# Patient Record
Sex: Male | Born: 1940 | Marital: Married
Health system: Southern US, Community
[De-identification: ages and names within clinical notes are randomized; demographics above are authoritative.]

---

## 2015-01-12 ENCOUNTER — Inpatient Hospital Stay
Admission: AD | Admit: 2015-01-12 | Discharge: 2015-02-13 | Disposition: A | Discharge status: Skilled Nursing Facility | Payer: Self-pay | Source: Ambulatory Visit | Attending: Internal Medicine | Admitting: Internal Medicine

## 2015-01-12 ENCOUNTER — Other Ambulatory Visit (HOSPITAL_COMMUNITY): Payer: Self-pay

## 2015-01-12 DIAGNOSIS — J811 Chronic pulmonary edema: Secondary | ICD-10-CM

## 2015-01-12 DIAGNOSIS — S065XAA Traumatic subdural hemorrhage with loss of consciousness status unknown, initial encounter: Secondary | ICD-10-CM

## 2015-01-12 DIAGNOSIS — K7460 Unspecified cirrhosis of liver: Secondary | ICD-10-CM

## 2015-01-12 DIAGNOSIS — K458 Other specified abdominal hernia without obstruction or gangrene: Secondary | ICD-10-CM

## 2015-01-12 DIAGNOSIS — Z95828 Presence of other vascular implants and grafts: Secondary | ICD-10-CM

## 2015-01-12 DIAGNOSIS — I639 Cerebral infarction, unspecified: Secondary | ICD-10-CM

## 2015-01-12 DIAGNOSIS — K469 Unspecified abdominal hernia without obstruction or gangrene: Secondary | ICD-10-CM

## 2015-01-12 DIAGNOSIS — J189 Pneumonia, unspecified organism: Secondary | ICD-10-CM

## 2015-01-12 DIAGNOSIS — Z431 Encounter for attention to gastrostomy: Secondary | ICD-10-CM

## 2015-01-12 DIAGNOSIS — S065X9A Traumatic subdural hemorrhage with loss of consciousness of unspecified duration, initial encounter: Secondary | ICD-10-CM

## 2015-01-13 ENCOUNTER — Other Ambulatory Visit (HOSPITAL_COMMUNITY): Payer: Self-pay

## 2015-01-13 LAB — URINALYSIS, ROUTINE W REFLEX MICROSCOPIC
GLUCOSE, UA: NEGATIVE mg/dL
HGB URINE DIPSTICK: NEGATIVE
KETONES UR: NEGATIVE mg/dL
LEUKOCYTES UA: NEGATIVE
Nitrite: NEGATIVE
PH: 7 (ref 5.0–8.0)
PROTEIN: NEGATIVE mg/dL
Specific Gravity, Urine: 1.022 (ref 1.005–1.030)
Urobilinogen, UA: >8 mg/dL — ABNORMAL HIGH (ref 0.0–1.0)

## 2015-01-13 LAB — COMPREHENSIVE METABOLIC PANEL
ALBUMIN: 2 g/dL — AB (ref 3.5–5.0)
ALK PHOS: 327 U/L — AB (ref 38–126)
ALT: 413 U/L — ABNORMAL HIGH (ref 17–63)
ANION GAP: 9 (ref 5–15)
AST: 261 U/L — ABNORMAL HIGH (ref 15–41)
BUN: 24 mg/dL — ABNORMAL HIGH (ref 6–20)
CHLORIDE: 112 mmol/L — AB (ref 101–111)
CO2: 25 mmol/L (ref 22–32)
Calcium: 8.7 mg/dL — ABNORMAL LOW (ref 8.9–10.3)
Creatinine, Ser: 0.84 mg/dL (ref 0.61–1.24)
GFR calc Af Amer: >60 mL/min (ref >60)
GFR calc non Af Amer: >60 mL/min (ref >60)
GLUCOSE: 128 mg/dL — AB (ref 65–99)
POTASSIUM: 3.9 mmol/L (ref 3.5–5.1)
SODIUM: 146 mmol/L — AB (ref 135–145)
Total Bilirubin: 1.4 mg/dL — ABNORMAL HIGH (ref 0.3–1.2)
Total Protein: 6.1 g/dL — ABNORMAL LOW (ref 6.5–8.1)

## 2015-01-13 LAB — CBC WITH DIFFERENTIAL/PLATELET
BASOS ABS: 0.2 10*3/uL — AB (ref 0.0–0.1)
Basophils Relative: 1 % (ref 0–1)
EOS ABS: 0.6 10*3/uL (ref 0.0–0.7)
Eosinophils Relative: 3 % (ref 0–5)
HCT: 30.2 % — ABNORMAL LOW (ref 39.0–52.0)
HEMOGLOBIN: 8.8 g/dL — AB (ref 13.0–17.0)
LYMPHS ABS: 1.5 10*3/uL (ref 0.7–4.0)
LYMPHS PCT: 8 % — AB (ref 12–46)
MCH: 21.5 pg — ABNORMAL LOW (ref 26.0–34.0)
MCHC: 29.1 g/dL — AB (ref 30.0–36.0)
MCV: 73.8 fL — ABNORMAL LOW (ref 78.0–100.0)
MONOS PCT: 8 % (ref 3–12)
Monocytes Absolute: 1.5 10*3/uL — ABNORMAL HIGH (ref 0.1–1.0)
Neutro Abs: 14.7 10*3/uL — ABNORMAL HIGH (ref 1.7–7.7)
Neutrophils Relative %: 80 % — ABNORMAL HIGH (ref 43–77)
PLATELETS: 1324 10*3/uL — AB (ref 150–400)
RBC: 4.09 MIL/uL — AB (ref 4.22–5.81)
RDW: 26 % — ABNORMAL HIGH (ref 11.5–15.5)
Smear Review: INCREASED
WBC: 18.5 10*3/uL — AB (ref 4.0–10.5)

## 2015-01-13 LAB — PATHOLOGIST SMEAR REVIEW: PATH REVIEW: INCREASED

## 2015-01-13 LAB — PROCALCITONIN: PROCALCITONIN: 0.56 ng/mL

## 2015-01-13 LAB — PHOSPHORUS: PHOSPHORUS: 3.1 mg/dL (ref 2.5–4.6)

## 2015-01-13 LAB — MAGNESIUM: Magnesium: 2.4 mg/dL (ref 1.7–2.4)

## 2015-01-13 LAB — VITAMIN B12: VITAMIN B 12: 997 pg/mL — AB (ref 180–914)

## 2015-01-14 LAB — EXPECTORATED SPUTUM ASSESSMENT W REFEX TO RESP CULTURE: SPECIAL REQUESTS: NORMAL

## 2015-01-14 LAB — BLOOD GAS, ARTERIAL
ACID-BASE EXCESS: 0 mmol/L (ref 0.0–2.0)
Bicarbonate: 23.3 mEq/L (ref 20.0–24.0)
FIO2: 0.35
O2 SAT: 93.7 %
PATIENT TEMPERATURE: 103
PO2 ART: 82.1 mmHg (ref 80.0–100.0)
TCO2: 24.3 mmol/L (ref 0–100)
pCO2 arterial: 36.2 mmHg (ref 35.0–45.0)
pH, Arterial: 7.436 (ref 7.350–7.450)

## 2015-01-14 LAB — COMPREHENSIVE METABOLIC PANEL
ALT: 367 U/L — ABNORMAL HIGH (ref 17–63)
ANION GAP: 7 (ref 5–15)
AST: 186 U/L — ABNORMAL HIGH (ref 15–41)
Albumin: 2 g/dL — ABNORMAL LOW (ref 3.5–5.0)
Alkaline Phosphatase: 336 U/L — ABNORMAL HIGH (ref 38–126)
BUN: 24 mg/dL — ABNORMAL HIGH (ref 6–20)
CHLORIDE: 112 mmol/L — AB (ref 101–111)
CO2: 26 mmol/L (ref 22–32)
Calcium: 8.8 mg/dL — ABNORMAL LOW (ref 8.9–10.3)
Creatinine, Ser: 0.8 mg/dL (ref 0.61–1.24)
Glucose, Bld: 150 mg/dL — ABNORMAL HIGH (ref 65–99)
POTASSIUM: 4 mmol/L (ref 3.5–5.1)
Sodium: 145 mmol/L (ref 135–145)
Total Bilirubin: 1 mg/dL (ref 0.3–1.2)
Total Protein: 6.5 g/dL (ref 6.5–8.1)

## 2015-01-14 LAB — CBC WITH DIFFERENTIAL/PLATELET
BASOS PCT: 1 % (ref 0–1)
Basophils Absolute: 0.2 10*3/uL — ABNORMAL HIGH (ref 0.0–0.1)
EOS ABS: 0.7 10*3/uL (ref 0.0–0.7)
EOS PCT: 4 % (ref 0–5)
HCT: 31.2 % — ABNORMAL LOW (ref 39.0–52.0)
Hemoglobin: 8.8 g/dL — ABNORMAL LOW (ref 13.0–17.0)
LYMPHS ABS: 1.9 10*3/uL (ref 0.7–4.0)
Lymphocytes Relative: 11 % — ABNORMAL LOW (ref 12–46)
MCH: 20.9 pg — AB (ref 26.0–34.0)
MCHC: 28.2 g/dL — ABNORMAL LOW (ref 30.0–36.0)
MCV: 73.9 fL — AB (ref 78.0–100.0)
MONO ABS: 0.9 10*3/uL (ref 0.1–1.0)
Monocytes Relative: 5 % (ref 3–12)
Neutro Abs: 13.9 10*3/uL — ABNORMAL HIGH (ref 1.7–7.7)
Neutrophils Relative %: 79 % — ABNORMAL HIGH (ref 43–77)
PLATELETS: 1451 10*3/uL — AB (ref 150–400)
RBC: 4.22 MIL/uL (ref 4.22–5.81)
RDW: 26.1 % — AB (ref 11.5–15.5)
WBC: 17.6 10*3/uL — AB (ref 4.0–10.5)

## 2015-01-14 LAB — EXPECTORATED SPUTUM ASSESSMENT W GRAM STAIN, RFLX TO RESP C

## 2015-01-14 LAB — URINE CULTURE
CULTURE: NO GROWTH
SPECIAL REQUESTS: NORMAL

## 2015-01-14 LAB — PHOSPHORUS: PHOSPHORUS: 3.1 mg/dL (ref 2.5–4.6)

## 2015-01-14 LAB — MAGNESIUM: MAGNESIUM: 2.5 mg/dL — AB (ref 1.7–2.4)

## 2015-01-15 ENCOUNTER — Other Ambulatory Visit (HOSPITAL_COMMUNITY): Payer: Self-pay

## 2015-01-15 LAB — CBC WITH DIFFERENTIAL/PLATELET
BASOS ABS: 0.2 10*3/uL — AB (ref 0.0–0.1)
BASOS PCT: 1 % (ref 0–1)
Eosinophils Absolute: 0.8 10*3/uL — ABNORMAL HIGH (ref 0.0–0.7)
Eosinophils Relative: 4 % (ref 0–5)
HEMATOCRIT: 34.2 % — AB (ref 39.0–52.0)
HEMOGLOBIN: 9.8 g/dL — AB (ref 13.0–17.0)
LYMPHS PCT: 10 % — AB (ref 12–46)
Lymphs Abs: 2.2 10*3/uL (ref 0.7–4.0)
MCH: 21.6 pg — ABNORMAL LOW (ref 26.0–34.0)
MCHC: 28.7 g/dL — AB (ref 30.0–36.0)
MCV: 75.3 fL — ABNORMAL LOW (ref 78.0–100.0)
MONOS PCT: 7 % (ref 3–12)
Monocytes Absolute: 1.7 10*3/uL — ABNORMAL HIGH (ref 0.1–1.0)
NEUTROS ABS: 17.9 10*3/uL — AB (ref 1.7–7.7)
NEUTROS PCT: 79 % — AB (ref 43–77)
Platelets: 1597 10*3/uL (ref 150–400)
RBC: 4.54 MIL/uL (ref 4.22–5.81)
RDW: 26.5 % — ABNORMAL HIGH (ref 11.5–15.5)
WBC: 22.7 10*3/uL — ABNORMAL HIGH (ref 4.0–10.5)

## 2015-01-15 LAB — BASIC METABOLIC PANEL
ANION GAP: 10 (ref 5–15)
BUN: 32 mg/dL — ABNORMAL HIGH (ref 6–20)
CHLORIDE: 110 mmol/L (ref 101–111)
CO2: 25 mmol/L (ref 22–32)
Calcium: 9.1 mg/dL (ref 8.9–10.3)
Creatinine, Ser: 0.98 mg/dL (ref 0.61–1.24)
GFR calc non Af Amer: >60 mL/min (ref >60)
Glucose, Bld: 113 mg/dL — ABNORMAL HIGH (ref 65–99)
Potassium: 4.4 mmol/L (ref 3.5–5.1)
Sodium: 145 mmol/L (ref 135–145)

## 2015-01-15 LAB — BLOOD GAS, ARTERIAL
Acid-Base Excess: 0.5 mmol/L (ref 0.0–2.0)
Bicarbonate: 24.2 mEq/L — ABNORMAL HIGH (ref 20.0–24.0)
FIO2: 0.35
O2 Saturation: 93.6 %
PATIENT TEMPERATURE: 99.8
PH ART: 7.431 (ref 7.350–7.450)
TCO2: 25.3 mmol/L (ref 0–100)
pCO2 arterial: 37.4 mmHg (ref 35.0–45.0)
pO2, Arterial: 76 mmHg — ABNORMAL LOW (ref 80.0–100.0)

## 2015-01-16 LAB — FOLATE RBC
FOLATE, HEMOLYSATE: 488.2 ng/mL
FOLATE, RBC: 1666 ng/mL (ref >498)
Hematocrit: 29.3 % — ABNORMAL LOW (ref 37.5–51.0)

## 2015-01-16 LAB — CBC WITH DIFFERENTIAL/PLATELET
BASOS PCT: 1 % (ref 0–1)
Basophils Absolute: 0.2 10*3/uL — ABNORMAL HIGH (ref 0.0–0.1)
EOS ABS: 0.7 10*3/uL (ref 0.0–0.7)
EOS PCT: 3 % (ref 0–5)
HEMATOCRIT: 31.4 % — AB (ref 39.0–52.0)
Hemoglobin: 8.9 g/dL — ABNORMAL LOW (ref 13.0–17.0)
LYMPHS PCT: 6 % — AB (ref 12–46)
Lymphs Abs: 1.4 10*3/uL (ref 0.7–4.0)
MCH: 21.3 pg — ABNORMAL LOW (ref 26.0–34.0)
MCHC: 28.3 g/dL — ABNORMAL LOW (ref 30.0–36.0)
MCV: 75.1 fL — ABNORMAL LOW (ref 78.0–100.0)
Monocytes Absolute: 1.4 10*3/uL — ABNORMAL HIGH (ref 0.1–1.0)
Monocytes Relative: 6 % (ref 3–12)
NEUTROS PCT: 84 % — AB (ref 43–77)
Neutro Abs: 19.9 10*3/uL — ABNORMAL HIGH (ref 1.7–7.7)
Platelets: 1307 10*3/uL (ref 150–400)
RBC: 4.18 MIL/uL — ABNORMAL LOW (ref 4.22–5.81)
RDW: 27.5 % — AB (ref 11.5–15.5)
WBC: 23.6 10*3/uL — ABNORMAL HIGH (ref 4.0–10.5)

## 2015-01-16 LAB — PROCALCITONIN: PROCALCITONIN: 0.36 ng/mL

## 2015-01-16 LAB — COMPREHENSIVE METABOLIC PANEL
ALT: 260 U/L — ABNORMAL HIGH (ref 17–63)
ANION GAP: 9 (ref 5–15)
AST: 115 U/L — ABNORMAL HIGH (ref 15–41)
Albumin: 2 g/dL — ABNORMAL LOW (ref 3.5–5.0)
Alkaline Phosphatase: 315 U/L — ABNORMAL HIGH (ref 38–126)
BUN: 40 mg/dL — ABNORMAL HIGH (ref 6–20)
CALCIUM: 8.8 mg/dL — AB (ref 8.9–10.3)
CHLORIDE: 113 mmol/L — AB (ref 101–111)
CO2: 25 mmol/L (ref 22–32)
Creatinine, Ser: 1.02 mg/dL (ref 0.61–1.24)
GFR calc non Af Amer: >60 mL/min (ref >60)
Glucose, Bld: 185 mg/dL — ABNORMAL HIGH (ref 65–99)
Potassium: 3.6 mmol/L (ref 3.5–5.1)
SODIUM: 147 mmol/L — AB (ref 135–145)
Total Bilirubin: 0.8 mg/dL (ref 0.3–1.2)
Total Protein: 6 g/dL — ABNORMAL LOW (ref 6.5–8.1)

## 2015-01-16 LAB — MAGNESIUM: MAGNESIUM: 2.6 mg/dL — AB (ref 1.7–2.4)

## 2015-01-16 LAB — SEDIMENTATION RATE: SED RATE: 75 mm/h — AB (ref 0–16)

## 2015-01-16 LAB — TSH: TSH: 0.282 u[IU]/mL — ABNORMAL LOW (ref 0.350–4.500)

## 2015-01-16 LAB — PHOSPHORUS: PHOSPHORUS: 3.3 mg/dL (ref 2.5–4.6)

## 2015-01-16 LAB — C-REACTIVE PROTEIN: CRP: 4.8 mg/dL — AB (ref <1.0)

## 2015-01-17 LAB — BASIC METABOLIC PANEL
Anion gap: 8 (ref 5–15)
BUN: 31 mg/dL — AB (ref 6–20)
CALCIUM: 7.3 mg/dL — AB (ref 8.9–10.3)
CO2: 25 mmol/L (ref 22–32)
CREATININE: 0.81 mg/dL (ref 0.61–1.24)
Chloride: 109 mmol/L (ref 101–111)
GFR calc Af Amer: >60 mL/min (ref >60)
GLUCOSE: 152 mg/dL — AB (ref 65–99)
Potassium: 4.3 mmol/L (ref 3.5–5.1)
SODIUM: 142 mmol/L (ref 135–145)

## 2015-01-17 LAB — CBC WITH DIFFERENTIAL/PLATELET
BASOS PCT: 1 % (ref 0–1)
Basophils Absolute: 0.2 10*3/uL — ABNORMAL HIGH (ref 0.0–0.1)
EOS ABS: 0.7 10*3/uL (ref 0.0–0.7)
EOS PCT: 4 % (ref 0–5)
HCT: 29.6 % — ABNORMAL LOW (ref 39.0–52.0)
Hemoglobin: 8.3 g/dL — ABNORMAL LOW (ref 13.0–17.0)
LYMPHS ABS: 1.9 10*3/uL (ref 0.7–4.0)
Lymphocytes Relative: 10 % — ABNORMAL LOW (ref 12–46)
MCH: 21.6 pg — AB (ref 26.0–34.0)
MCHC: 28 g/dL — AB (ref 30.0–36.0)
MCV: 76.9 fL — AB (ref 78.0–100.0)
MONO ABS: 0.9 10*3/uL (ref 0.1–1.0)
Monocytes Relative: 5 % (ref 3–12)
NEUTROS PCT: 80 % — AB (ref 43–77)
Neutro Abs: 14.8 10*3/uL — ABNORMAL HIGH (ref 1.7–7.7)
PLATELETS: 1446 10*3/uL — AB (ref 150–400)
RBC: 3.85 MIL/uL — ABNORMAL LOW (ref 4.22–5.81)
RDW: 27.6 % — AB (ref 11.5–15.5)
WBC: 18.5 10*3/uL — ABNORMAL HIGH (ref 4.0–10.5)

## 2015-01-17 LAB — PHOSPHORUS: Phosphorus: 2.3 mg/dL — ABNORMAL LOW (ref 2.5–4.6)

## 2015-01-17 LAB — MAGNESIUM: MAGNESIUM: 2 mg/dL (ref 1.7–2.4)

## 2015-01-17 LAB — T4, FREE: Free T4: 0.71 ng/dL (ref 0.61–1.12)

## 2015-01-18 ENCOUNTER — Other Ambulatory Visit (HOSPITAL_COMMUNITY): Payer: Self-pay

## 2015-01-18 LAB — CBC WITH DIFFERENTIAL/PLATELET
BASOS PCT: 1 % (ref 0–1)
Basophils Absolute: 0.2 10*3/uL — ABNORMAL HIGH (ref 0.0–0.1)
EOS ABS: 0.9 10*3/uL — AB (ref 0.0–0.7)
Eosinophils Relative: 4 % (ref 0–5)
HCT: 30.5 % — ABNORMAL LOW (ref 39.0–52.0)
Hemoglobin: 8.5 g/dL — ABNORMAL LOW (ref 13.0–17.0)
Lymphocytes Relative: 9 % — ABNORMAL LOW (ref 12–46)
Lymphs Abs: 2 10*3/uL (ref 0.7–4.0)
MCH: 21.5 pg — AB (ref 26.0–34.0)
MCHC: 27.9 g/dL — AB (ref 30.0–36.0)
MCV: 77 fL — AB (ref 78.0–100.0)
MONO ABS: 0.9 10*3/uL (ref 0.1–1.0)
Monocytes Relative: 4 % (ref 3–12)
NEUTROS ABS: 18.6 10*3/uL — AB (ref 1.7–7.7)
NEUTROS PCT: 82 % — AB (ref 43–77)
PLATELETS: 1501 10*3/uL — AB (ref 150–400)
RBC: 3.96 MIL/uL — ABNORMAL LOW (ref 4.22–5.81)
RDW: 27.5 % — AB (ref 11.5–15.5)
WBC: 22.6 10*3/uL — ABNORMAL HIGH (ref 4.0–10.5)

## 2015-01-18 LAB — BASIC METABOLIC PANEL
Anion gap: 10 (ref 5–15)
BUN: 25 mg/dL — AB (ref 6–20)
CALCIUM: 8.9 mg/dL (ref 8.9–10.3)
CO2: 27 mmol/L (ref 22–32)
Chloride: 106 mmol/L (ref 101–111)
Creatinine, Ser: 0.82 mg/dL (ref 0.61–1.24)
GFR calc Af Amer: >60 mL/min (ref >60)
GLUCOSE: 140 mg/dL — AB (ref 65–99)
Potassium: 3.5 mmol/L (ref 3.5–5.1)
Sodium: 143 mmol/L (ref 135–145)

## 2015-01-18 LAB — HEMOGLOBIN A1C
HEMOGLOBIN A1C: 5.6 % (ref 4.8–5.6)
MEAN PLASMA GLUCOSE: 114 mg/dL

## 2015-01-18 LAB — CULTURE, RESPIRATORY W GRAM STAIN

## 2015-01-18 LAB — CULTURE, RESPIRATORY

## 2015-01-18 LAB — VANCOMYCIN, TROUGH: Vancomycin Tr: 11 ug/mL (ref 10.0–20.0)

## 2015-01-19 LAB — MAGNESIUM: MAGNESIUM: 2.1 mg/dL (ref 1.7–2.4)

## 2015-01-19 LAB — CBC WITH DIFFERENTIAL/PLATELET
BASOS ABS: 0.2 10*3/uL — AB (ref 0.0–0.1)
BASOS PCT: 1 % (ref 0–1)
EOS ABS: 0.7 10*3/uL (ref 0.0–0.7)
Eosinophils Relative: 3 % (ref 0–5)
HEMATOCRIT: 32 % — AB (ref 39.0–52.0)
Hemoglobin: 8.7 g/dL — ABNORMAL LOW (ref 13.0–17.0)
LYMPHS ABS: 1.8 10*3/uL (ref 0.7–4.0)
Lymphocytes Relative: 8 % — ABNORMAL LOW (ref 12–46)
MCH: 20.9 pg — AB (ref 26.0–34.0)
MCHC: 27.2 g/dL — ABNORMAL LOW (ref 30.0–36.0)
MCV: 76.9 fL — AB (ref 78.0–100.0)
MONO ABS: 0.9 10*3/uL (ref 0.1–1.0)
Monocytes Relative: 4 % (ref 3–12)
NEUTROS ABS: 19.5 10*3/uL — AB (ref 1.7–7.7)
Neutrophils Relative %: 84 % — ABNORMAL HIGH (ref 43–77)
Platelets: 1390 10*3/uL (ref 150–400)
RBC: 4.16 MIL/uL — ABNORMAL LOW (ref 4.22–5.81)
RDW: 28.8 % — AB (ref 11.5–15.5)
Smear Review: INCREASED
WBC: 23.1 10*3/uL — ABNORMAL HIGH (ref 4.0–10.5)

## 2015-01-19 LAB — COMPREHENSIVE METABOLIC PANEL
ALT: 213 U/L — AB (ref 17–63)
AST: 103 U/L — AB (ref 15–41)
Albumin: 2 g/dL — ABNORMAL LOW (ref 3.5–5.0)
Alkaline Phosphatase: 286 U/L — ABNORMAL HIGH (ref 38–126)
Anion gap: 9 (ref 5–15)
BILIRUBIN TOTAL: 0.9 mg/dL (ref 0.3–1.2)
BUN: 23 mg/dL — AB (ref 6–20)
CO2: 29 mmol/L (ref 22–32)
CREATININE: 0.74 mg/dL (ref 0.61–1.24)
Calcium: 9 mg/dL (ref 8.9–10.3)
Chloride: 106 mmol/L (ref 101–111)
Glucose, Bld: 145 mg/dL — ABNORMAL HIGH (ref 65–99)
POTASSIUM: 3.2 mmol/L — AB (ref 3.5–5.1)
Sodium: 144 mmol/L (ref 135–145)
TOTAL PROTEIN: 6.3 g/dL — AB (ref 6.5–8.1)

## 2015-01-19 LAB — CULTURE, BLOOD (ROUTINE X 2)
CULTURE: NO GROWTH
Culture: NO GROWTH

## 2015-01-19 LAB — PHOSPHORUS: PHOSPHORUS: 3 mg/dL (ref 2.5–4.6)

## 2015-01-20 ENCOUNTER — Other Ambulatory Visit (HOSPITAL_COMMUNITY): Payer: Self-pay

## 2015-01-20 LAB — BASIC METABOLIC PANEL
ANION GAP: 8 (ref 5–15)
BUN: 23 mg/dL — ABNORMAL HIGH (ref 6–20)
CALCIUM: 9.3 mg/dL (ref 8.9–10.3)
CHLORIDE: 108 mmol/L (ref 101–111)
CO2: 29 mmol/L (ref 22–32)
Creatinine, Ser: 0.83 mg/dL (ref 0.61–1.24)
GFR calc non Af Amer: >60 mL/min (ref >60)
GLUCOSE: 154 mg/dL — AB (ref 65–99)
POTASSIUM: 3.7 mmol/L (ref 3.5–5.1)
Sodium: 145 mmol/L (ref 135–145)

## 2015-01-20 LAB — CBC WITH DIFFERENTIAL/PLATELET
BASOS PCT: 1 % (ref 0–1)
Basophils Absolute: 0.3 10*3/uL — ABNORMAL HIGH (ref 0.0–0.1)
EOS PCT: 3 % (ref 0–5)
Eosinophils Absolute: 0.8 10*3/uL — ABNORMAL HIGH (ref 0.0–0.7)
HEMATOCRIT: 33.6 % — AB (ref 39.0–52.0)
HEMOGLOBIN: 9.5 g/dL — AB (ref 13.0–17.0)
Lymphocytes Relative: 6 % — ABNORMAL LOW (ref 12–46)
Lymphs Abs: 1.5 10*3/uL (ref 0.7–4.0)
MCH: 22.2 pg — ABNORMAL LOW (ref 26.0–34.0)
MCHC: 28.3 g/dL — ABNORMAL LOW (ref 30.0–36.0)
MCV: 78.5 fL (ref 78.0–100.0)
MONOS PCT: 4 % (ref 3–12)
Monocytes Absolute: 1 10*3/uL (ref 0.1–1.0)
NEUTROS PCT: 86 % — AB (ref 43–77)
Neutro Abs: 21.9 10*3/uL — ABNORMAL HIGH (ref 1.7–7.7)
Platelets: 1365 10*3/uL (ref 150–400)
RBC: 4.28 MIL/uL (ref 4.22–5.81)
RDW: 28.3 % — ABNORMAL HIGH (ref 11.5–15.5)
WBC: 25.5 10*3/uL — AB (ref 4.0–10.5)

## 2015-01-20 LAB — BRAIN NATRIURETIC PEPTIDE: B Natriuretic Peptide: 79.6 pg/mL (ref 0.0–100.0)

## 2015-01-21 LAB — CBC WITH DIFFERENTIAL/PLATELET
BASOS PCT: 1 % (ref 0–1)
Basophils Absolute: 0.2 10*3/uL — ABNORMAL HIGH (ref 0.0–0.1)
Eosinophils Absolute: 0.6 10*3/uL (ref 0.0–0.7)
Eosinophils Relative: 3 % (ref 0–5)
HEMATOCRIT: 32.6 % — AB (ref 39.0–52.0)
HEMOGLOBIN: 9 g/dL — AB (ref 13.0–17.0)
LYMPHS ABS: 1.9 10*3/uL (ref 0.7–4.0)
LYMPHS PCT: 9 % — AB (ref 12–46)
MCH: 21.4 pg — AB (ref 26.0–34.0)
MCHC: 27.6 g/dL — ABNORMAL LOW (ref 30.0–36.0)
MCV: 77.4 fL — AB (ref 78.0–100.0)
MONOS PCT: 5 % (ref 3–12)
Monocytes Absolute: 1.1 10*3/uL — ABNORMAL HIGH (ref 0.1–1.0)
NEUTROS ABS: 17.8 10*3/uL — AB (ref 1.7–7.7)
Neutrophils Relative %: 82 % — ABNORMAL HIGH (ref 43–77)
Platelets: 1230 10*3/uL (ref 150–400)
RBC: 4.21 MIL/uL — ABNORMAL LOW (ref 4.22–5.81)
RDW: 29.3 % — ABNORMAL HIGH (ref 11.5–15.5)
WBC: 21.6 10*3/uL — ABNORMAL HIGH (ref 4.0–10.5)

## 2015-01-21 LAB — BASIC METABOLIC PANEL
Anion gap: 9 (ref 5–15)
BUN: 24 mg/dL — AB (ref 6–20)
CHLORIDE: 106 mmol/L (ref 101–111)
CO2: 28 mmol/L (ref 22–32)
Calcium: 9.1 mg/dL (ref 8.9–10.3)
Creatinine, Ser: 0.79 mg/dL (ref 0.61–1.24)
GFR calc Af Amer: >60 mL/min (ref >60)
GFR calc non Af Amer: >60 mL/min (ref >60)
GLUCOSE: 113 mg/dL — AB (ref 65–99)
POTASSIUM: 3.8 mmol/L (ref 3.5–5.1)
Sodium: 143 mmol/L (ref 135–145)

## 2015-01-21 LAB — PROCALCITONIN: PROCALCITONIN: 0.16 ng/mL

## 2015-01-23 LAB — CBC WITH DIFFERENTIAL/PLATELET
BASOS ABS: 0.2 10*3/uL — AB (ref 0.0–0.1)
Basophils Relative: 1 % (ref 0–1)
EOS ABS: 0.8 10*3/uL — AB (ref 0.0–0.7)
Eosinophils Relative: 4 % (ref 0–5)
HCT: 34.2 % — ABNORMAL LOW (ref 39.0–52.0)
Hemoglobin: 9.4 g/dL — ABNORMAL LOW (ref 13.0–17.0)
LYMPHS PCT: 9 % — AB (ref 12–46)
Lymphs Abs: 1.9 10*3/uL (ref 0.7–4.0)
MCH: 21.4 pg — ABNORMAL LOW (ref 26.0–34.0)
MCHC: 27.5 g/dL — ABNORMAL LOW (ref 30.0–36.0)
MCV: 77.7 fL — ABNORMAL LOW (ref 78.0–100.0)
MONO ABS: 0.8 10*3/uL (ref 0.1–1.0)
Monocytes Relative: 4 % (ref 3–12)
NEUTROS PCT: 82 % — AB (ref 43–77)
Neutro Abs: 17 10*3/uL — ABNORMAL HIGH (ref 1.7–7.7)
PLATELETS: 1010 10*3/uL — AB (ref 150–400)
RBC: 4.4 MIL/uL (ref 4.22–5.81)
RDW: 29.1 % — AB (ref 11.5–15.5)
WBC: 20.7 10*3/uL — AB (ref 4.0–10.5)

## 2015-01-23 LAB — BASIC METABOLIC PANEL
ANION GAP: 10 (ref 5–15)
BUN: 24 mg/dL — ABNORMAL HIGH (ref 6–20)
CALCIUM: 9.2 mg/dL (ref 8.9–10.3)
CO2: 28 mmol/L (ref 22–32)
Chloride: 104 mmol/L (ref 101–111)
Creatinine, Ser: 0.74 mg/dL (ref 0.61–1.24)
GLUCOSE: 137 mg/dL — AB (ref 65–99)
Potassium: 3.8 mmol/L (ref 3.5–5.1)
SODIUM: 142 mmol/L (ref 135–145)

## 2015-01-25 LAB — CULTURE, BLOOD (ROUTINE X 2)
CULTURE: NO GROWTH
Culture: NO GROWTH

## 2015-01-26 ENCOUNTER — Other Ambulatory Visit (HOSPITAL_COMMUNITY): Payer: Self-pay

## 2015-01-26 LAB — CBC
HCT: 34.9 % — ABNORMAL LOW (ref 39.0–52.0)
HEMOGLOBIN: 9.8 g/dL — AB (ref 13.0–17.0)
MCH: 21.7 pg — AB (ref 26.0–34.0)
MCHC: 28.1 g/dL — AB (ref 30.0–36.0)
MCV: 77.2 fL — AB (ref 78.0–100.0)
Platelets: 777 10*3/uL — ABNORMAL HIGH (ref 150–400)
RBC: 4.52 MIL/uL (ref 4.22–5.81)
RDW: 28.5 % — ABNORMAL HIGH (ref 11.5–15.5)
WBC: 17.4 10*3/uL — ABNORMAL HIGH (ref 4.0–10.5)

## 2015-01-26 LAB — RENAL FUNCTION PANEL
ANION GAP: 8 (ref 5–15)
Albumin: 2.2 g/dL — ABNORMAL LOW (ref 3.5–5.0)
BUN: 27 mg/dL — ABNORMAL HIGH (ref 6–20)
CHLORIDE: 104 mmol/L (ref 101–111)
CO2: 31 mmol/L (ref 22–32)
Calcium: 9.3 mg/dL (ref 8.9–10.3)
Creatinine, Ser: 0.87 mg/dL (ref 0.61–1.24)
GFR calc non Af Amer: >60 mL/min (ref >60)
Glucose, Bld: 142 mg/dL — ABNORMAL HIGH (ref 65–99)
Phosphorus: 3.2 mg/dL (ref 2.5–4.6)
Potassium: 3.5 mmol/L (ref 3.5–5.1)
Sodium: 143 mmol/L (ref 135–145)

## 2015-01-30 ENCOUNTER — Other Ambulatory Visit (HOSPITAL_COMMUNITY): Payer: Self-pay

## 2015-01-30 ENCOUNTER — Encounter: Payer: Self-pay | Admitting: Radiology

## 2015-01-30 LAB — BASIC METABOLIC PANEL
Anion gap: 9 (ref 5–15)
BUN: 25 mg/dL — AB (ref 6–20)
CALCIUM: 9.3 mg/dL (ref 8.9–10.3)
CO2: 29 mmol/L (ref 22–32)
Chloride: 103 mmol/L (ref 101–111)
Creatinine, Ser: 0.63 mg/dL (ref 0.61–1.24)
GFR calc Af Amer: >60 mL/min (ref >60)
GLUCOSE: 111 mg/dL — AB (ref 65–99)
POTASSIUM: 3.4 mmol/L — AB (ref 3.5–5.1)
Sodium: 141 mmol/L (ref 135–145)

## 2015-01-30 LAB — CBC WITH DIFFERENTIAL/PLATELET
BASOS PCT: 1 % (ref 0–1)
Basophils Absolute: 0.2 10*3/uL — ABNORMAL HIGH (ref 0.0–0.1)
EOS ABS: 1.1 10*3/uL — AB (ref 0.0–0.7)
Eosinophils Relative: 7 % — ABNORMAL HIGH (ref 0–5)
HCT: 35.1 % — ABNORMAL LOW (ref 39.0–52.0)
Hemoglobin: 10 g/dL — ABNORMAL LOW (ref 13.0–17.0)
LYMPHS ABS: 1.8 10*3/uL (ref 0.7–4.0)
Lymphocytes Relative: 12 % (ref 12–46)
MCH: 22.2 pg — AB (ref 26.0–34.0)
MCHC: 28.5 g/dL — AB (ref 30.0–36.0)
MCV: 77.8 fL — AB (ref 78.0–100.0)
MONO ABS: 0.9 10*3/uL (ref 0.1–1.0)
Monocytes Relative: 6 % (ref 3–12)
NEUTROS ABS: 11.4 10*3/uL — AB (ref 1.7–7.7)
Neutrophils Relative %: 74 % (ref 43–77)
PLATELETS: 576 10*3/uL — AB (ref 150–400)
RBC: 4.51 MIL/uL (ref 4.22–5.81)
RDW: 27.4 % — AB (ref 11.5–15.5)
WBC: 15.4 10*3/uL — ABNORMAL HIGH (ref 4.0–10.5)

## 2015-01-30 LAB — PHOSPHORUS: Phosphorus: 2.7 mg/dL (ref 2.5–4.6)

## 2015-01-30 LAB — MAGNESIUM: Magnesium: 2 mg/dL (ref 1.7–2.4)

## 2015-01-31 NOTE — Progress Notes (Signed)
Chief Complaint: Patient was seen in consultation today for gastrostomy tube placement at the request of Dr. Stanton Kidney  Referring Physician(s): Dr. Stanton Kidney  History of Present Illness: James Villa is a 74 y.o. male who suffered subdural bleed and subsequent encephalopathy and respiratory failure at outside facility. Now has tracheostomy and is admitted to [facility]. He continues to be encephalopathic and has been receiving TF via NGT. IR is requested to place G-tube. Has been treated for PNA and WBC is trending down. Afebrile CT of abdomen completed and reviewed.  Allergies: Review of patient's allergies indicates not on file.  Medications: See list    No family history on file.  Social History   Social History  . Marital Status: Married    Spouse Name: N/A  . Number of Children: N/A  . Years of Education: N/A   Social History Main Topics  . Smoking status: None  . Smokeless tobacco: None  . Alcohol Use: None  . Drug Use: None  . Sexual Activity: Not Asked   Other Topics Concern  . None   Social History Narrative  . None    ECOG Status: 4 - Bedbound  Review of Systems: A 12 point ROS discussed and pertinent positives are indicated in the HPI above.  All other systems are negative.  Review of Systems  Vital Signs: There were no vitals taken for this visit.  Physical Exam  Constitutional: He appears well-developed. No distress.  HENT:  Head: Normocephalic.  Mouth/Throat: Oropharynx is clear and moist.  Neck:  Tracheostomy present, clean  Cardiovascular: Normal rate, regular rhythm and normal heart sounds.   Pulmonary/Chest: Effort normal and breath sounds normal. No respiratory distress.  Abdominal: Soft. Bowel sounds are normal. He exhibits no mass. There is no tenderness.  Neurological:  Pt does not respond to verbal or physical stimulus    Mallampati Score:  MD Evaluation Airway: Other (comments) Airway comments:  tracheostomy Heart: WNL Abdomen: WNL Chest/ Lungs: WNL ASA  Classification: 3 Mallampati/Airway Score:  (tracheostomy)  Imaging: Ct Abdomen Wo Contrast  01/30/2015   CLINICAL DATA:  Evaluate anatomy prior to potential percutaneous gastrostomy tube placement  EXAM: CT ABDOMEN WITHOUT CONTRAST  TECHNIQUE: Multidetector CT imaging of the abdomen was performed following the standard protocol without IV contrast.  COMPARISON:  None.  FINDINGS: Enteric tube courses through the stomach and imaged proximal aspect of the duodenum, tip terminating within the proximal aspect of the horizontal segment of the duodenum (as confirmed on the provided sequela radiograph. A portion of the splenic flexure of the colon appears interposed about the mid body of the stomach. Scattered colonic diverticulosis without evidence of diverticulitis. No pneumoperitoneum, pneumatosis or portal venous gas.  Normal hepatic contour. Normal noncontrast appearance of the gallbladder. No radiopaque gallstones. No ascites.  Note is made of an approximately 3.1 cm hypo attenuating (-17 Hounsfield unit) renal lesion, incompletely characterized without intravenous contrast so favored to represent renal cysts. Scattered bilateral sub cm hypo attenuating renal lesions are too small to adequately characterize though also favored to represent additional renal cysts. No renal stones. No urinary obstruction.  Normal noncontrast appearance of the bilateral adrenal glands, pancreas and spleen. Incidental note is made of a small splenule.  Calcified atherosclerotic plaque within a normal caliber abdominal aorta.  Limited visualization of lower thorax demonstrates minimal subsegmental atelectasis within image right lower lobe. Note is made of 2 punctate (approximately 4 mm) nodules within the imaged right middle lobe (image 3,  series 3). There is an ill-defined area of slightly nodular ground-glass within the left costophrenic angle (image 7).  Borderline  cardiomegaly. Coronary artery calcifications. Trace amount of pericardial fluid, likely physiologic. There is diffuse decreased attenuation of the intra cardiac blood pool suggestive of anemia.  No acute or aggressive osseous abnormalities.  Regional soft tissues appear normal.  IMPRESSION: 1. Gastric anatomy allows for attempted fluoroscopic guided placement of a percutaneous gastrostomy tube. 2. Colonic diverticulosis without evidence of diverticulitis. 3. Coronary artery calcifications.   Electronically Signed   By: Simonne Come M.D.   On: 01/30/2015 11:51   Ct Head Wo Contrast  01/26/2015   CLINICAL DATA:  Subdural hematoma, limited responsiveness  EXAM: CT HEAD WITHOUT CONTRAST  TECHNIQUE: Contiguous axial images were obtained from the base of the skull through the vertex without intravenous contrast.  COMPARISON:  None available  FINDINGS: Post LEFT temporoparietal craniotomy.  Generalized atrophy.  Normal ventricular morphology.  LEFT-to-RIGHT midline shift approximately 5 mm.  Small vessel chronic ischemic changes of deep cerebral white matter.  Large area of low attenuation involving the LEFT occipital and parietal occipital region compatible with acute infarction.  This extends into posterior aspect of LEFT temporal lobe as well as into the calcarine cortex.  High attenuation hemorrhage at deep LEFT hemisphere at the LEFT lateral aspect of the splenium of the corpus callosum.  No additional intracranial hemorrhage.  High old LEFT frontal infarct.  Old RIGHT thymic lacunar infarct with low-attenuation at the LEFT thalamus which may represent acute infarct as well.  Significant LEFT mastoid effusion.  No additional focal osseous abnormalities.  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  High attenuation acute hemorrhage at the LEFT lateral aspect of the splenium of the corpus callosum.  Old infarcts RIGHT thalamus and high LEFT frontal  region with question acute nonhemorrhagic LEFT thalamic infarct.  5 mm of LEFT-to-RIGHT midline shift.  Findings called to Rometta Emery on [facility] on 01/26/2015 at 1202 hours.   Electronically Signed   By: Ulyses Southward M.D.   On: 01/26/2015 12:03   US Abdomen Port  01/15/2015   CLINICAL DATA:  Cirrhosis.  EXAM: ULTRASOUND PORTABLE ABDOMEN  COMPARISON:  None.  FINDINGS: Gallbladder: Normal gallbladder, without stone or wall thickening. Patient not responsive so sonographic Eulah Pont sign could not be evaluated.  Common bile duct: Diameter: Normal, 5 mm.  Liver: Increased echogenicity, at least partially due to hepatic steatosis. Suspect a mildly irregular hepatic capsule, suboptimally evaluated.  IVC: No abnormality visualized.  Pancreas: Poorly visualized due to overlying bowel gas.  Spleen: Size and appearance within normal limits.  Right Kidney: Length: 11.7 cm. 2.7 cm interpolar renal lesion is likely a minimally complex cyst.  Left Kidney: Length: 11.2 cm. No hydronephrosis. Hypoechoic lesion measures 1.6 cm in the inter/ upper pole region on image 65. There is also a lower pole hypoechoic 1.3 cm lesion on image 74.  Abdominal aorta: Poorly visualized due to overlying bowel gas.  Other findings: No gross ascites.  Exam degraded by patient lack of responsiveness.  IMPRESSION: 1. Decreased sensitivity and specificity exam due to technique related factors, as described above. 2. Hepatic steatosis. Cannot exclude cirrhosis. Suboptimally evaluated. 3. Left renal lesions which are indeterminate. Pre and postcontrast outpatient nonemergent CT should be considered.   Electronically Signed   By: Jeronimo Greaves M.D.   On: 01/15/2015 11:03   Dg Chest Port 1 View  01/30/2015   CLINICAL DATA:  Pneumonia  EXAM: PORTABLE CHEST - 1 VIEW  COMPARISON:  01/20/2015  FINDINGS: Right upper extremity PICC, tip at the distal SVC. Unchanged positioning of tracheostomy and feeding tubes.  Low lung volumes with streaky basilar opacities, left  more than right. No edema, effusion, or air leak.  IMPRESSION: Hypoventilation and bibasilar atelectasis, similar to 10 days ago. Superimposed bronchopneumonia could be present.   Electronically Signed   By: Marnee Spring M.D.   On: 01/30/2015 07:57   Dg Chest Port 1 View  01/20/2015   CLINICAL DATA:  Pneumonia  EXAM: PORTABLE CHEST - 1 VIEW  COMPARISON:  January 18, 2015  FINDINGS: Tracheostomy catheter tip is 3.8 cm above the carina. Central catheter tip is in the superior vena cava near the cavoatrial junction. Feeding tube tip is below the diaphragm. No pneumothorax. There is atelectatic change in both lung bases. There are small pleural effusions bilaterally. There is mild airspace consolidation in the medial right base.  Elsewhere lungs are clear. Heart is borderline enlarged with pulmonary vascularity within normal limits. There is atherosclerotic change in the aorta. No adenopathy.  IMPRESSION: Tube and catheter positions as described without pneumothorax. Consolidation medial right base. Bibasilar atelectasis with small pleural effusions bilaterally, slightly larger on the right than on the left. No change in cardiac silhouette.   Electronically Signed   By: Bretta Bang III M.D.   On: 01/20/2015 07:48   Dg Chest Port 1 View  01/18/2015   CLINICAL DATA:  74 year old male with recent low-grade fever cough and congestion. Initial encounter.  EXAM: PORTABLE CHEST - 1 VIEW  COMPARISON:  01/15/2015, 01/13/2015  FINDINGS: Portable AP semi upright view at 0550 hrs. Tracheostomy tube is more rotated to the right but otherwise appears stable. Feeding tube courses to the left abdomen, tip not included. Stable right PICC line.  Stable lung volumes with veiling opacity at both lung bases. Stable cardiac size and mediastinal contours. No pneumothorax. No acute pulmonary edema. Diaphragm contour remains obscured. No areas of worsening ventilation.  IMPRESSION: 1.  Stable lines and tubes. 2. Stable confluent  bibasilar opacity suggesting a combination of pleural effusion and lower low collapse or consolidation.   Electronically Signed   By: Odessa Fleming M.D.   On: 01/18/2015 07:49   Dg Chest Port 1 View  01/15/2015   CLINICAL DATA:  PICC placement.  Initial encounter.  EXAM: PORTABLE CHEST - 1 VIEW  COMPARISON:  Chest radiograph performed 01/13/2015  FINDINGS: The patient's right PICC is noted ending about the cavoatrial junction.  The patient's tracheostomy tube is seen ending 4-5 cm above the carina. An enteric tube is noted extending below the diaphragm.  The lungs are hypoexpanded. Vascular congestion is noted. Mild bibasilar opacities may reflect atelectasis or possibly pneumonia. A small left pleural effusion is suspected. No pneumothorax is seen.  The cardiomediastinal silhouette is borderline normal in size. No acute osseous abnormalities are identified.  IMPRESSION: 1. Right PICC noted ending about the cavoatrial junction. 2. Lungs hypoexpanded. Vascular congestion noted. Mild bibasilar airspace opacities may reflect atelectasis or possibly pneumonia. Suspect small left pleural effusion.   Electronically Signed   By: Roanna Raider M.D.   On: 01/15/2015 20:28   Dg Chest Port 1 View  01/13/2015   CLINICAL DATA:  Low-grade fever, cough and congestion.  EXAM: PORTABLE CHEST - 1 VIEW  COMPARISON:  None.  FINDINGS: Tracheostomy tube appears in adequate position. Enteric tube courses through the region of the stomach and off the in  for portion of the film. Lungs are adequately inflated with mild bilateral prominence of the perihilar markings with mild bibasilar opacification. Mild cardiomegaly. There is calcified plaque over the thoracic aorta. There are mild degenerative changes of the spine.  IMPRESSION: Cardiomegaly with mild bilateral perihilar bibasilar opacification as findings are likely due to minimal interstitial edema with small bilateral effusions and atelectasis. Cannot exclude infection in the lung  bases.  Tubes and lines as described.   Electronically Signed   By: Elberta Fortis M.D.   On: 01/13/2015 14:16   Dg Abd Portable 1v  01/12/2015   CLINICAL DATA:  Evaluate feeding tube placement  EXAM: PORTABLE ABDOMEN - 1 VIEW  COMPARISON:  None.  FINDINGS: The tip of the feeding tube is well below the GE junction. The feeding tube tip is in the projection of the third portion of the duodenum.  IMPRESSION: 1. Tip of the feeding tube is likely post pyloric at the level of the third portion of duodenum.   Electronically Signed   By: Signa Kell M.D.   On: 01/12/2015 18:17    Labs:  CBC:  Recent Labs  01/21/15 0600 01/23/15 0533 01/26/15 0745 01/30/15 0659  WBC 21.6* 20.7* 17.4* 15.4*  HGB 9.0* 9.4* 9.8* 10.0*  HCT 32.6* 34.2* 34.9* 35.1*  PLT 1230* 1010* 777* 576*    COAGS:   BMP:  Recent Labs  01/21/15 0600 01/23/15 0533 01/26/15 0745 01/30/15 0659  NA 143 142 143 141  K 3.8 3.8 3.5 3.4*  CL 106 104 104 103  CO2 GLUCOSE 113* 137* 142* 111*  BUN 24* 24* 27* 25*  CALCIUM 9.1 9.2 9.3 9.3  CREATININE 0.79 0.74 0.87 0.63  GFRNONAA >60 >60 >60 >60  GFRAA >60 >60 >60 >60    LIVER FUNCTION TESTS:  Recent Labs  01/13/15 0636 01/14/15 0556 01/16/15 0622 01/19/15 0650 01/26/15 0745  BILITOT 1.4* 1.0 0.8 0.9  --   AST 261* 186* 115* 103*  --   ALT 413* 367* 260* 213*  --   ALKPHOS 327* 336* 315* 286*  --   PROT 6.1* 6.5 6.0* 6.3*  --   ALBUMIN 2.0* 2.0* 2.0* 2.0* 2.2*    Assessment and Plan: Dysphagia/FTT from encephalopathy Plan for perc G-tube tomorrow or next day. Attempted to contact son to obtain consent, had to leave VM on # listed Will need to discuss with son/daughter and obtain consent    Signed: Brayton El 01/31/2015, 2:54 PM   I spent a total of 20 minutesin face to face in clinical consultation, greater than 50% of which was counseling/coordinating care for gastrostomy tube placement

## 2015-02-01 LAB — CBC
HCT: 36.2 % — ABNORMAL LOW (ref 39.0–52.0)
HEMOGLOBIN: 10.5 g/dL — AB (ref 13.0–17.0)
MCH: 22.3 pg — AB (ref 26.0–34.0)
MCHC: 29 g/dL — ABNORMAL LOW (ref 30.0–36.0)
MCV: 77 fL — ABNORMAL LOW (ref 78.0–100.0)
Platelets: 556 10*3/uL — ABNORMAL HIGH (ref 150–400)
RBC: 4.7 MIL/uL (ref 4.22–5.81)
RDW: 26.7 % — ABNORMAL HIGH (ref 11.5–15.5)
WBC: 15.4 10*3/uL — ABNORMAL HIGH (ref 4.0–10.5)

## 2015-02-01 LAB — PROTIME-INR
INR: 1.19 (ref 0.00–1.49)
PROTHROMBIN TIME: 15.3 s — AB (ref 11.6–15.2)

## 2015-02-02 ENCOUNTER — Other Ambulatory Visit (HOSPITAL_COMMUNITY): Payer: Self-pay

## 2015-02-02 ENCOUNTER — Other Ambulatory Visit: Payer: Self-pay | Admitting: Interventional Radiology

## 2015-02-02 MED ORDER — MIDAZOLAM HCL 2 MG/2ML IJ SOLN
INTRAMUSCULAR | Status: AC
  Filled 2015-02-02: qty 2

## 2015-02-02 MED ORDER — GLUCAGON HCL RDNA (DIAGNOSTIC) 1 MG IJ SOLR
INTRAMUSCULAR | Status: AC
  Filled 2015-02-02: qty 1

## 2015-02-02 MED ORDER — MIDAZOLAM HCL 2 MG/2ML IJ SOLN
INTRAMUSCULAR | Status: AC | PRN
  Administered 2015-02-02: 0.5 mg via INTRAVENOUS

## 2015-02-02 MED ORDER — FENTANYL CITRATE (PF) 100 MCG/2ML IJ SOLN
INTRAMUSCULAR | Status: AC
  Filled 2015-02-02: qty 2

## 2015-02-02 MED ORDER — LORAZEPAM 2 MG/ML IJ SOLN
INTRAMUSCULAR | Status: AC | PRN
  Administered 2015-02-02: 0.5 mg via INTRAVENOUS

## 2015-02-02 MED ORDER — LIDOCAINE HCL 1 % IJ SOLN
INTRAMUSCULAR | Status: AC
  Filled 2015-02-02: qty 20

## 2015-02-02 MED ORDER — CEFAZOLIN SODIUM-DEXTROSE 2-3 GM-% IV SOLR
INTRAVENOUS | Status: AC
  Administered 2015-02-02: 2 g via INTRAVENOUS
  Filled 2015-02-02: qty 50

## 2015-02-02 MED ORDER — CEFAZOLIN SODIUM-DEXTROSE 2-3 GM-% IV SOLR
2.0000 g | Freq: Once | INTRAVENOUS | Status: AC
  Administered 2015-02-02: 2 g via INTRAVENOUS

## 2015-02-02 MED ORDER — FENTANYL CITRATE (PF) 100 MCG/2ML IJ SOLN
INTRAMUSCULAR | Status: AC | PRN
  Administered 2015-02-02: 25 ug via INTRAVENOUS

## 2015-02-02 NOTE — Procedures (Signed)
Interventional Radiology Procedure Note  Procedure: Placement of percutaneous 20F pull-through gastrostomy tube. Complications: None Recommendations: - NPO except for sips and chips remainder of today and overnight - Maintain G-tube to LWS until tomorrow morning  - May advance diet as tolerated and begin using tube tomorrow morning  Signed,   Leydy Worthey S. Nissa Stannard, DO   

## 2015-02-02 NOTE — Sedation Documentation (Signed)
Patient is resting comfortably. Suctioned pt

## 2015-02-02 NOTE — Sedation Documentation (Signed)
Patient is resting comfortably. 

## 2015-02-03 NOTE — Progress Notes (Signed)
Referring Physician(s): Dr. Stanton Kidney  Chief Complaint: James Villa is a 74 y.o. male who suffered subdural bleed and subsequent encephalopathy and respiratory failure at outside facility. Now has tracheostomy and is admitted to [facility]. He continues to be encephalopathic and has been receiving TF via NGT. He is s/p Perc G-Tube yesterday by Dr. Loreta Ave   Subjective:  James Villa is sleeping.  He did not awaken when I examined his abdomen/G-tube.  Allergies: Review of patient's allergies indicates no known allergies.  Medications: Prior to Admission medications   Not on File     Vital Signs: BP 159/86 mmHg  Pulse 107  Resp 20  SpO2 92%  Physical Exam  Sleeping G-tube site clean, no leaking Abdomen soft, NTND  Imaging: Ct Abdomen Wo Contrast  01/30/2015   CLINICAL DATA:  Evaluate anatomy prior to potential percutaneous gastrostomy tube placement  EXAM: CT ABDOMEN WITHOUT CONTRAST  TECHNIQUE: Multidetector CT imaging of the abdomen was performed following the standard protocol without IV contrast.  COMPARISON:  None.  FINDINGS: Enteric tube courses through the stomach and imaged proximal aspect of the duodenum, tip terminating within the proximal aspect of the horizontal segment of the duodenum (as confirmed on the provided sequela radiograph. A portion of the splenic flexure of the colon appears interposed about the mid body of the stomach. Scattered colonic diverticulosis without evidence of diverticulitis. No pneumoperitoneum, pneumatosis or portal venous gas.  Normal hepatic contour. Normal noncontrast appearance of the gallbladder. No radiopaque gallstones. No ascites.  Note is made of an approximately 3.1 cm hypo attenuating (-17 Hounsfield unit) renal lesion, incompletely characterized without intravenous contrast so favored to represent renal cysts. Scattered bilateral sub cm hypo attenuating renal lesions are too small to adequately characterize though also  favored to represent additional renal cysts. No renal stones. No urinary obstruction.  Normal noncontrast appearance of the bilateral adrenal glands, pancreas and spleen. Incidental note is made of a small splenule.  Calcified atherosclerotic plaque within a normal caliber abdominal aorta.  Limited visualization of lower thorax demonstrates minimal subsegmental atelectasis within image right lower lobe. Note is made of 2 punctate (approximately 4 mm) nodules within the imaged right middle lobe (image 3, series 3). There is an ill-defined area of slightly nodular ground-glass within the left costophrenic angle (image 7).  Borderline cardiomegaly. Coronary artery calcifications. Trace amount of pericardial fluid, likely physiologic. There is diffuse decreased attenuation of the intra cardiac blood pool suggestive of anemia.  No acute or aggressive osseous abnormalities.  Regional soft tissues appear normal.  IMPRESSION: 1. Gastric anatomy allows for attempted fluoroscopic guided placement of a percutaneous gastrostomy tube. 2. Colonic diverticulosis without evidence of diverticulitis. 3. Coronary artery calcifications.   Electronically Signed   By: Simonne Come M.D.   On: 01/30/2015 11:51   Ir Gastrostomy Tube Mod Sed  02/02/2015   CLINICAL DATA:  74 year old male with a history of subdural hemorrhage, with current dysphagia. He has been referred for percutaneous gastrostomy tube placement.  EXAM: PERCUTANEOUS GASTROSTOMY  FLUOROSCOPY TIME:  2 MINUTES 6 SECONDS  MEDICATIONS AND MEDICAL HISTORY: Versed 0.5 mg, Fentanyl 25 mcg.  2.0 g IV Ancef procedural  ANESTHESIA/SEDATION: Moderate sedation time: 7 minutes  CONTRAST:  Small contrast through the tube.  PROCEDURE: The procedure, risks, benefits, and alternatives were explained to the patient's family. Questions regarding the procedure were encouraged and answered. The patient understands and consents to the procedure.  The epigastrium was prepped with Betadine in a  sterile fashion, and a sterile drape was applied covering the operative field. A sterile gown and sterile gloves were used for the procedure.  A 5-French orogastric tube is placed under fluoroscopic guidance.  The stomach was distended with gas. Under fluoroscopic guidance, an 18 gauge needle was utilized to puncture the anterior wall of the body of the stomach. An Amplatz wire was advanced through the needle passing a T fastener into the lumen of the stomach. The T fastener was secured for gastropexy. A 9-French sheath was inserted.  A snare was advanced through the 9-French sheath. A Teena Dunk was advanced through the orogastric tube. It was snared then pulled out the oral cavity, pulling the snare, as well. The leading edge of the gastrostomy was attached to the snare. It was then pulled down the esophagus and out the percutaneous site. It was secured in place. Contrast was injected. No complication.  Patient tolerated the procedure well. No significant blood loss encounter.  COMPLICATIONS: None  FINDINGS: The image demonstrates placement of a 20-French pull-through type gastrostomy tube into the body of the stomach.  IMPRESSION: Status post placement of percutaneous gastrostomy tube.  Signed,  Yvone Neu. Loreta Ave DO  [facility]  [facility]  PLAN: Low wall suction for 24 hours.  No usage of the tube other then medications with a flush for 24 hours.  May use the tube for enteral feedings after 24 hours.   Electronically Signed   By: Gilmer Mor D.O.   On: 02/02/2015 09:14    Labs:  CBC:  Recent Labs  01/23/15 0533 01/26/15 0745 01/30/15 0659 02/01/15 0500  WBC 20.7* 17.4* 15.4* 15.4*  HGB 9.4* 9.8* 10.0* 10.5*  HCT 34.2* 34.9* 35.1* 36.2*  PLT 1010* 777* 576* 556*    COAGS:  Recent Labs  02/01/15 0500  INR 1.19    BMP:  Recent Labs  01/21/15 0600 01/23/15 0533 01/26/15 0745 01/30/15 0659  NA 143 142 143 141  K 3.8 3.8 3.5 3.4*  CL  106 104 104 103  CO2 28 28 31 29   GLUCOSE 113* 137* 142* 111*  BUN 24* 24* 27* 25*  CALCIUM 9.1 9.2 9.3 9.3  CREATININE 0.79 0.74 0.87 0.63  GFRNONAA >60 >60 >60 >60  GFRAA >60 >60 >60 >60    LIVER FUNCTION TESTS:  Recent Labs  01/13/15 0636 01/14/15 0556 01/16/15 0622 01/19/15 0650 01/26/15 0745  BILITOT 1.4* 1.0 0.8 0.9  --   AST 261* 186* 115* 103*  --   ALT 413* 367* 260* 213*  --   ALKPHOS 327* 336* 315* 286*  --   PROT 6.1* 6.5 6.0* 6.3*  --   ALBUMIN 2.0* 2.0* 2.0* 2.0* 2.2*    Assessment and Plan:  S/P Gastrostomy Tube placement yesterday by Dr. Marlise Eves to start tube feeds.  Signed: Gwynneth Macleod PA-C 02/03/2015, 11:54 AM   I spent a total of 15 Minutes at the the patient's bedside AND on the patient's hospital floor or unit, greater than 50% of which was counseling/coordinating care for f/u after perc Gtube

## 2015-02-04 LAB — CBC WITH DIFFERENTIAL/PLATELET
BASOS PCT: 1 %
Basophils Absolute: 0.2 10*3/uL — ABNORMAL HIGH (ref 0.0–0.1)
EOS ABS: 1 10*3/uL — AB (ref 0.0–0.7)
Eosinophils Relative: 6 %
HCT: 36.1 % — ABNORMAL LOW (ref 39.0–52.0)
Hemoglobin: 10.9 g/dL — ABNORMAL LOW (ref 13.0–17.0)
LYMPHS ABS: 2.4 10*3/uL (ref 0.7–4.0)
Lymphocytes Relative: 14 %
MCH: 23 pg — AB (ref 26.0–34.0)
MCHC: 30.2 g/dL (ref 30.0–36.0)
MCV: 76.3 fL — AB (ref 78.0–100.0)
MONO ABS: 1.4 10*3/uL — AB (ref 0.1–1.0)
Monocytes Relative: 8 %
NEUTROS ABS: 12.2 10*3/uL — AB (ref 1.7–7.7)
NEUTROS PCT: 71 %
PLATELETS: 417 10*3/uL — AB (ref 150–400)
RBC: 4.73 MIL/uL (ref 4.22–5.81)
RDW: 25.9 % — AB (ref 11.5–15.5)
WBC: 17.2 10*3/uL — ABNORMAL HIGH (ref 4.0–10.5)

## 2015-02-04 LAB — BASIC METABOLIC PANEL
Anion gap: 10 (ref 5–15)
BUN: 65 mg/dL — AB (ref 6–20)
CALCIUM: 9.4 mg/dL (ref 8.9–10.3)
CO2: 28 mmol/L (ref 22–32)
CREATININE: 1 mg/dL (ref 0.61–1.24)
Chloride: 104 mmol/L (ref 101–111)
GFR calc Af Amer: >60 mL/min (ref >60)
GLUCOSE: 113 mg/dL — AB (ref 65–99)
Potassium: 3.4 mmol/L — ABNORMAL LOW (ref 3.5–5.1)
Sodium: 142 mmol/L (ref 135–145)

## 2015-02-06 LAB — BASIC METABOLIC PANEL
Anion gap: 8 (ref 5–15)
BUN: 56 mg/dL — AB (ref 6–20)
CALCIUM: 9.5 mg/dL (ref 8.9–10.3)
CO2: 32 mmol/L (ref 22–32)
CREATININE: 0.74 mg/dL (ref 0.61–1.24)
Chloride: 103 mmol/L (ref 101–111)
GFR calc Af Amer: >60 mL/min (ref >60)
GLUCOSE: 131 mg/dL — AB (ref 65–99)
Potassium: 3.5 mmol/L (ref 3.5–5.1)
SODIUM: 143 mmol/L (ref 135–145)

## 2015-02-06 LAB — PROCALCITONIN: PROCALCITONIN: 0.2 ng/mL

## 2015-02-06 LAB — CBC WITH DIFFERENTIAL/PLATELET
BASOS PCT: 1 %
Basophils Absolute: 0.1 10*3/uL (ref 0.0–0.1)
EOS ABS: 0.8 10*3/uL — AB (ref 0.0–0.7)
EOS PCT: 6 %
HCT: 34.6 % — ABNORMAL LOW (ref 39.0–52.0)
Hemoglobin: 10.1 g/dL — ABNORMAL LOW (ref 13.0–17.0)
LYMPHS ABS: 1.7 10*3/uL (ref 0.7–4.0)
Lymphocytes Relative: 13 %
MCH: 22.6 pg — AB (ref 26.0–34.0)
MCHC: 29.2 g/dL — ABNORMAL LOW (ref 30.0–36.0)
MCV: 77.4 fL — AB (ref 78.0–100.0)
MONO ABS: 0.9 10*3/uL (ref 0.1–1.0)
Monocytes Relative: 7 %
NEUTROS PCT: 73 %
Neutro Abs: 9.6 10*3/uL — ABNORMAL HIGH (ref 1.7–7.7)
PLATELETS: 442 10*3/uL — AB (ref 150–400)
RBC: 4.47 MIL/uL (ref 4.22–5.81)
RDW: 25.6 % — AB (ref 11.5–15.5)
WBC: 13.1 10*3/uL — AB (ref 4.0–10.5)

## 2015-02-10 LAB — CBC WITH DIFFERENTIAL/PLATELET
BASOS ABS: 0.1 10*3/uL (ref 0.0–0.1)
Basophils Relative: 1 %
EOS PCT: 7 %
Eosinophils Absolute: 0.8 10*3/uL — ABNORMAL HIGH (ref 0.0–0.7)
HCT: 33 % — ABNORMAL LOW (ref 39.0–52.0)
HEMOGLOBIN: 9.6 g/dL — AB (ref 13.0–17.0)
LYMPHS ABS: 1.4 10*3/uL (ref 0.7–4.0)
Lymphocytes Relative: 13 %
MCH: 22.4 pg — AB (ref 26.0–34.0)
MCHC: 29.1 g/dL — ABNORMAL LOW (ref 30.0–36.0)
MCV: 76.9 fL — ABNORMAL LOW (ref 78.0–100.0)
MONO ABS: 0.6 10*3/uL (ref 0.1–1.0)
Monocytes Relative: 6 %
NEUTROS PCT: 73 %
Neutro Abs: 7.8 10*3/uL — ABNORMAL HIGH (ref 1.7–7.7)
PLATELETS: 405 10*3/uL — AB (ref 150–400)
RBC: 4.29 MIL/uL (ref 4.22–5.81)
RDW: 25.1 % — ABNORMAL HIGH (ref 11.5–15.5)
WBC: 10.7 10*3/uL — AB (ref 4.0–10.5)

## 2015-02-10 LAB — COMPREHENSIVE METABOLIC PANEL
ALBUMIN: 2.4 g/dL — AB (ref 3.5–5.0)
ALK PHOS: 138 U/L — AB (ref 38–126)
ALT: 42 U/L (ref 17–63)
AST: 86 U/L — AB (ref 15–41)
Anion gap: 5 (ref 5–15)
BUN: 19 mg/dL (ref 6–20)
CALCIUM: 9.4 mg/dL (ref 8.9–10.3)
CHLORIDE: 102 mmol/L (ref 101–111)
CO2: 29 mmol/L (ref 22–32)
CREATININE: 0.47 mg/dL — AB (ref 0.61–1.24)
GFR calc non Af Amer: >60 mL/min (ref >60)
GLUCOSE: 107 mg/dL — AB (ref 65–99)
Potassium: 5.5 mmol/L — ABNORMAL HIGH (ref 3.5–5.1)
SODIUM: 136 mmol/L (ref 135–145)
Total Bilirubin: 1.1 mg/dL (ref 0.3–1.2)
Total Protein: 6 g/dL — ABNORMAL LOW (ref 6.5–8.1)

## 2015-02-11 LAB — POTASSIUM: POTASSIUM: 3.8 mmol/L (ref 3.5–5.1)

## 2015-05-21 DEATH — deceased

## 2016-05-21 IMAGING — US US ABDOMEN PORT
1 series · 13 of 25 positions shown · non-contrast
Comparison: None.

CLINICAL DATA: Cirrhosis.

EXAM:
ULTRASOUND PORTABLE ABDOMEN

[Series 1: us abdomen port · 0.22mm/px · 13 of 75 slices shown]
[im 1/75]
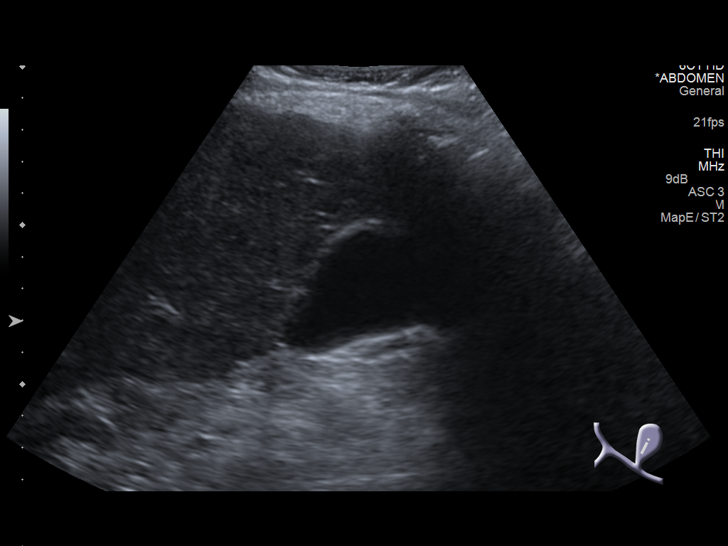
[im 7/75]
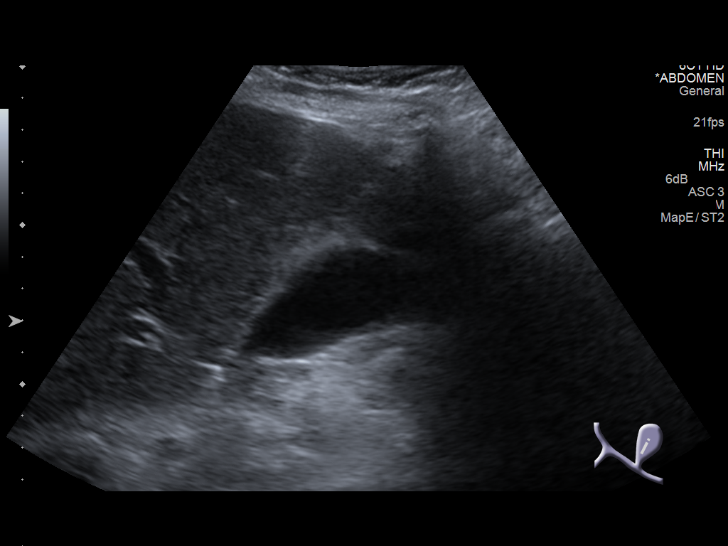
[im 13/75]
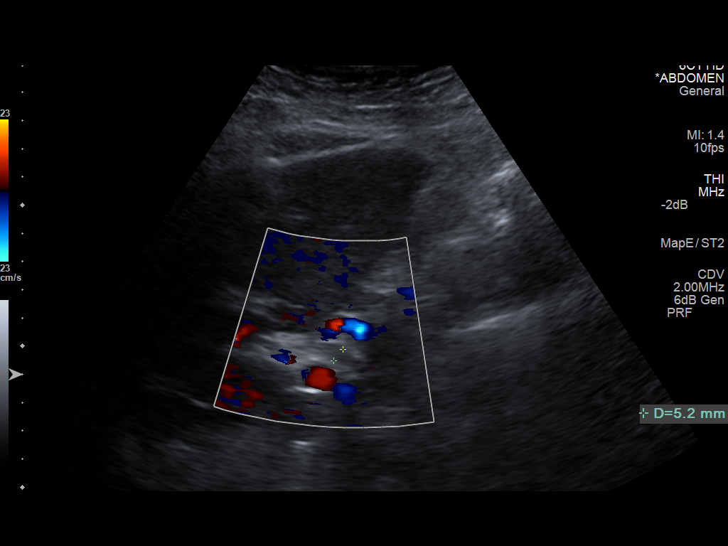
[im 19/75]
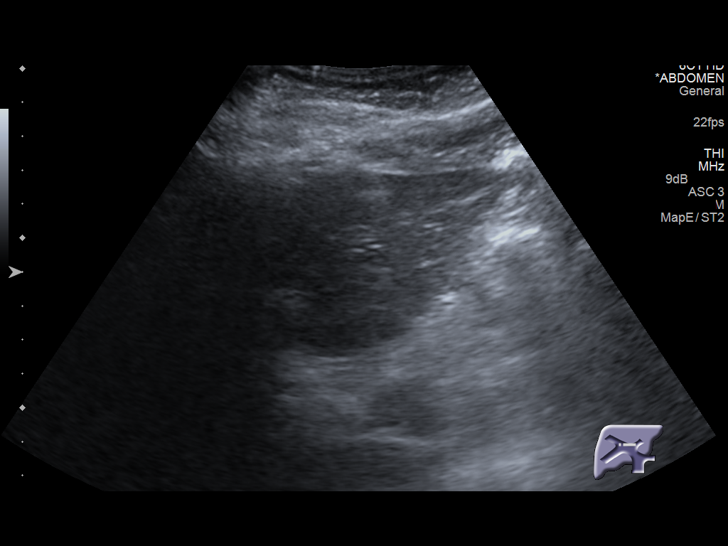
[im 25/75]
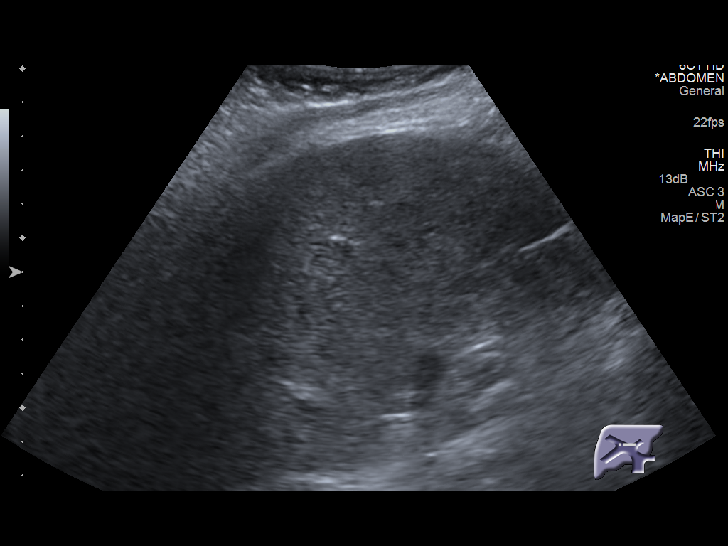
[im 31/75]
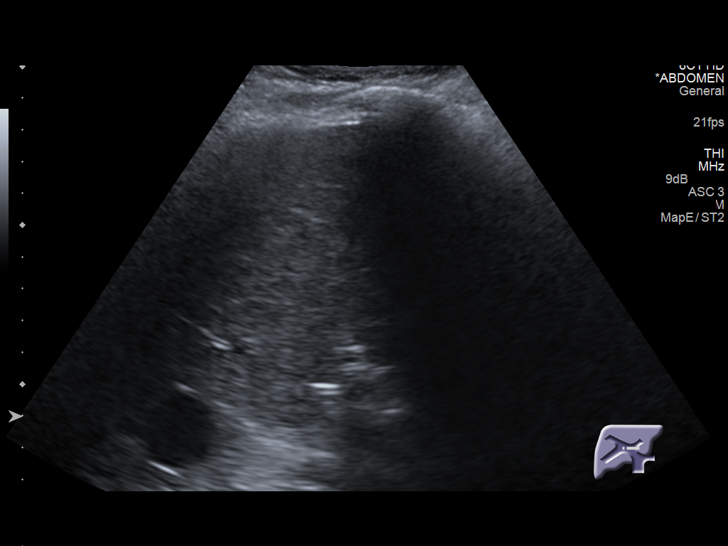
[im 38/75]
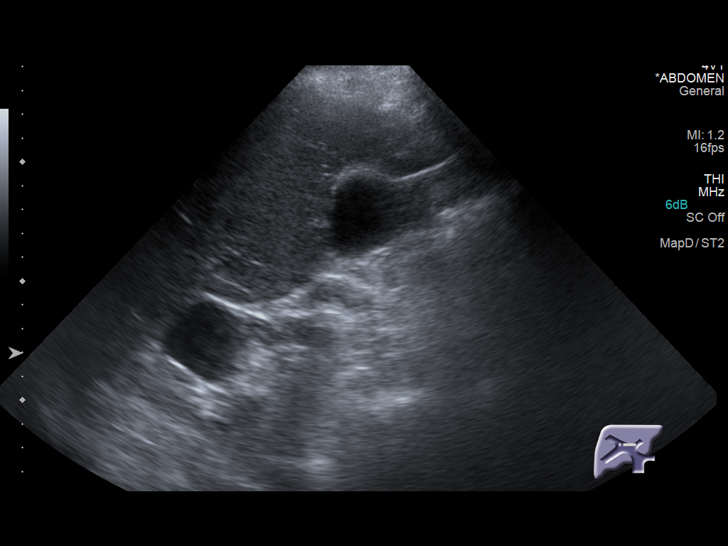
[im 44/75]
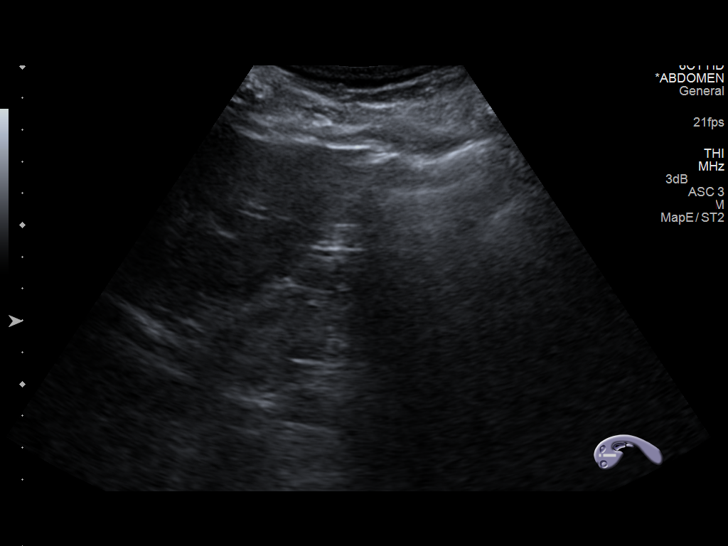
[im 50/75]
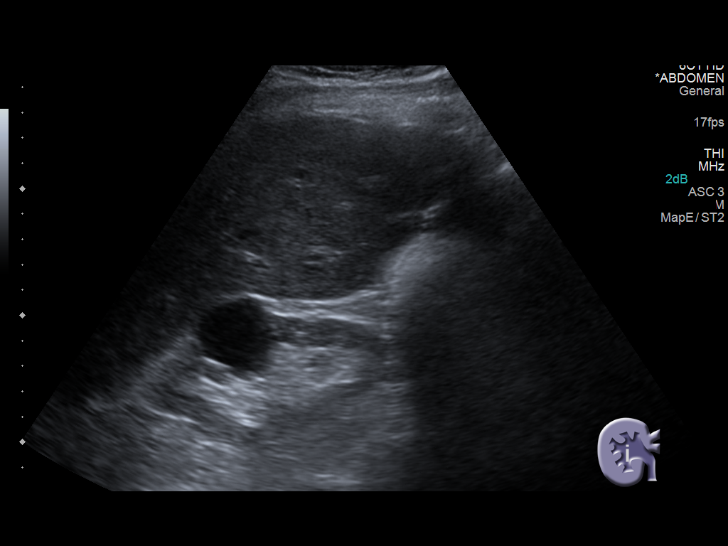
[im 56/75]
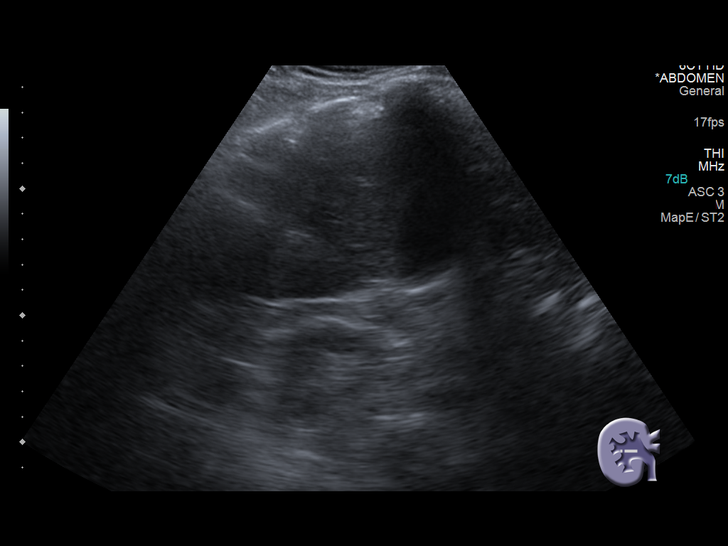
[im 62/75]
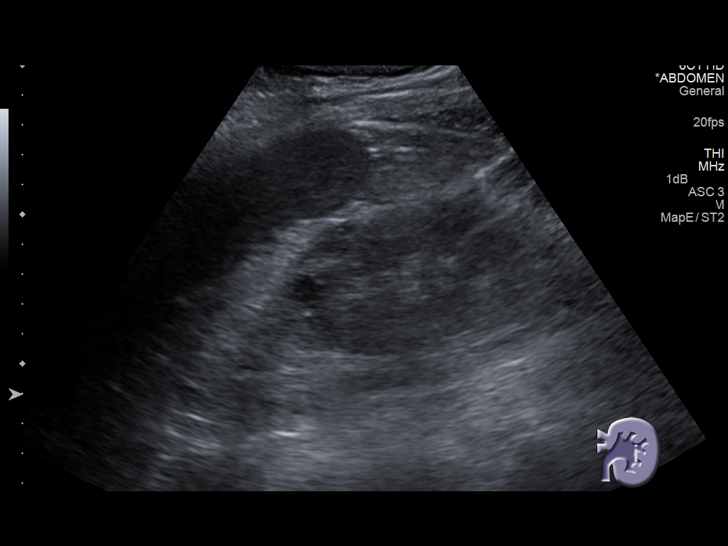
[im 68/75]
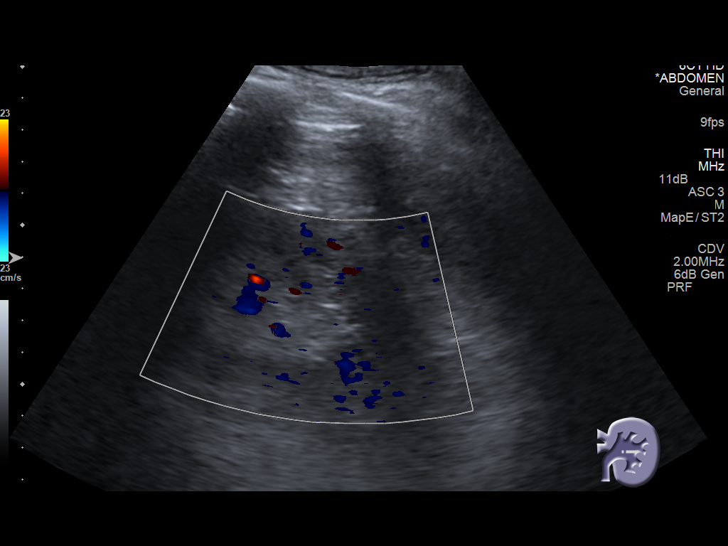
[im 75/75]
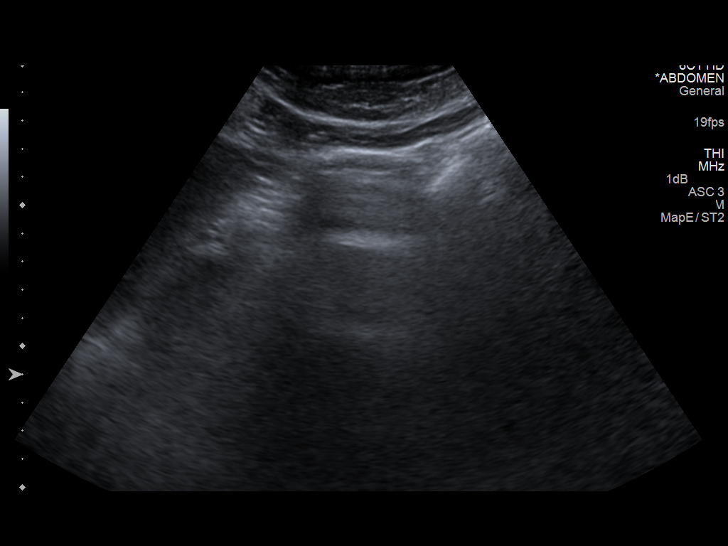

[13 of 25 positions shown; findings below may reference images not displayed]

FINDINGS: Gallbladder: Normal gallbladder, without stone or wall thickening.
Patient not responsive so sonographic Murphy sign could not be
evaluated.

Common bile duct: Diameter: Normal, 5 mm.

Liver: Increased echogenicity, at least partially due to hepatic
steatosis. Suspect a mildly irregular hepatic capsule, suboptimally
evaluated.

IVC: No abnormality visualized.

Pancreas: Poorly visualized due to overlying bowel gas.

Spleen: Size and appearance within normal limits.

Right Kidney: Length: 11.7 cm. 2.7 cm interpolar renal lesion is
likely a minimally complex cyst..

Left Kidney: Length: 11.2 cm.. No hydronephrosis. Hypoechoic lesion
measures 1.6 cm in the inter/ upper pole region on image 65. There
is also a lower pole hypoechoic 1.3 cm lesion on image 74.

Abdominal aorta: Poorly visualized due to overlying bowel gas.

Other findings: No gross ascites.

Exam degraded by patient lack of responsiveness.
IMPRESSION: 1. Decreased sensitivity and specificity exam due to technique
related factors, as described above.
2. Hepatic steatosis. Cannot exclude cirrhosis. Suboptimally
evaluated.
3. Left renal lesions which are indeterminate. Pre and postcontrast
outpatient nonemergent CT should be considered.

## 2016-11-03 IMAGING — DX DG CHEST 1V PORT
1 series · 1 of 1 positions shown · non-contrast
Comparison: None.

CLINICAL DATA: Low-grade fever, cough and congestion.

EXAM:
PORTABLE CHEST - 1 VIEW

[chest ap]
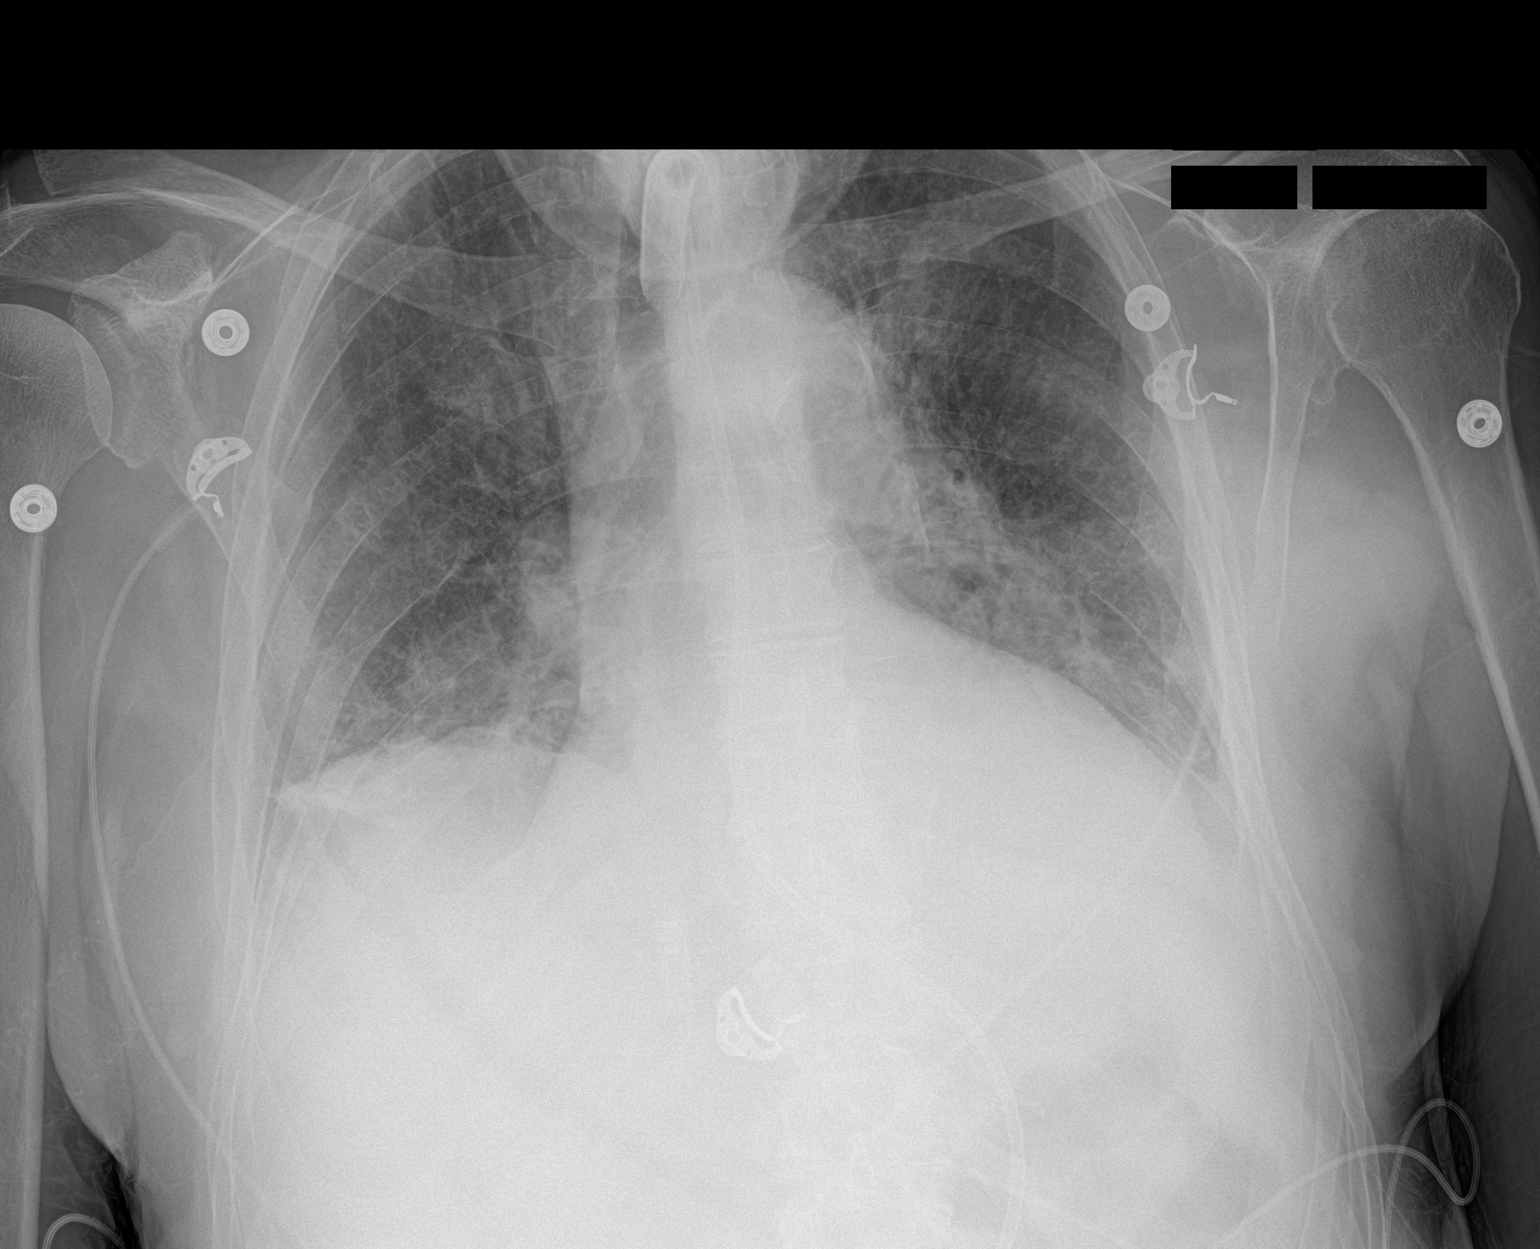

[1 of 1 positions shown; findings below may reference images not displayed]

FINDINGS: Tracheostomy tube appears in adequate position. Enteric tube courses
through the region of the stomach and off the in for portion of the
film. Lungs are adequately inflated with mild bilateral prominence
of the perihilar markings with mild bibasilar opacification. Mild
cardiomegaly. There is calcified plaque over the thoracic aorta.
There are mild degenerative changes of the spine.
IMPRESSION: Cardiomegaly with mild bilateral perihilar bibasilar opacification
as findings are likely due to minimal interstitial edema with small
bilateral effusions and atelectasis. Cannot exclude infection in the
lung bases.

Tubes and lines as described.

## 2016-11-20 IMAGING — CR DG CHEST 1V PORT
1 series · 1 of 1 positions shown · non-contrast
Comparison: 01/20/2015

CLINICAL DATA: Pneumonia

EXAM:
PORTABLE CHEST - 1 VIEW

[AP]
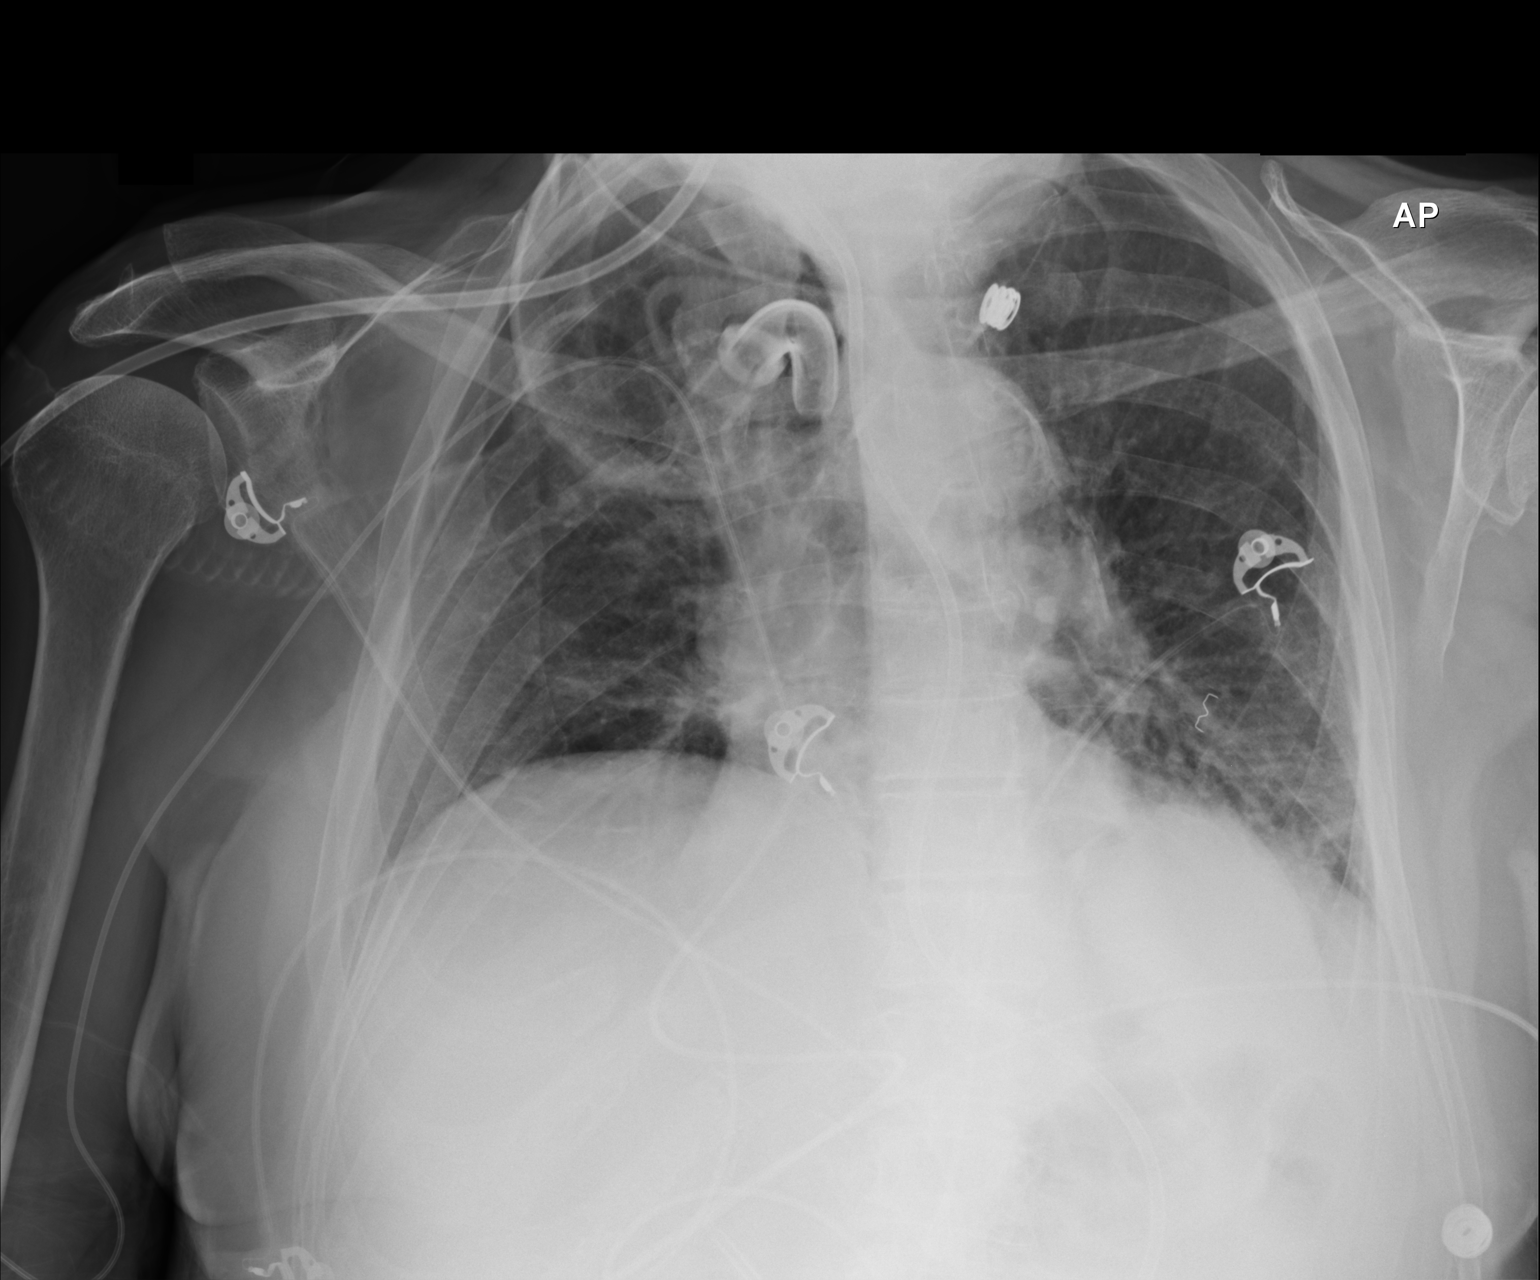

[1 of 1 positions shown; findings below may reference images not displayed]

FINDINGS: Right upper extremity PICC, tip at the distal SVC. Unchanged
positioning of tracheostomy and feeding tubes.

Low lung volumes with streaky basilar opacities, left more than
right. No edema, effusion, or air leak.
IMPRESSION: Hypoventilation and bibasilar atelectasis, similar to 10 days ago.
Superimposed bronchopneumonia could be present.
